# Patient Record
Sex: Female | Born: 1995 | Hispanic: Yes | Smoking: Never smoker
Health system: Southern US, Community
[De-identification: ages and names within clinical notes are randomized; demographics above are authoritative.]

## PROBLEM LIST (undated history)

## (undated) ENCOUNTER — Inpatient Hospital Stay (HOSPITAL_COMMUNITY): Payer: Self-pay

## (undated) DIAGNOSIS — O1490 Unspecified pre-eclampsia, unspecified trimester: Secondary | ICD-10-CM

## (undated) HISTORY — PX: NO PAST SURGERIES: SHX2092

---
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
## 2017-11-11 NOTE — L&D Delivery Note (Addendum)
Patient is [age] y.o. G2P1001 1659w2d admitted in SOL, hx of Pre-E in previous pregnancy   Delivery Note At 2:29 PM a viable female was delivered via Vaginal, Spontaneous (Presentation: Cephalic; LOA ).  APGAR: 8, 9; weight  .   Placenta status: spontaneous, intact.  Cord: 3VC   Anesthesia:  IV fentanyl Episiotomy:  none Lacerations:  Right labial, left sulcus laceration Suture Repair: 3.0 vicryl Est. Blood Loss (mL):  600  Mom to postpartum.  Baby to Couplet care / Skin to Skin.  Upon arrival patient was complete and pushing. She pushed with good maternal effort to deliver a healthy baby girl. Baby delivered following a roughly 30 second shoulder dystocia, McRoberts maneuver was performed, suprapubic pressure was applied and ultimately, the posterior shoulder was hooked to deliver the posterior shoulder which resulted in the delivery of the impacted shoulder  Baby was noted to have good tone and placed on maternal abdomen for oral suctioning, drying and stimulation. Delayed cord clamping performed. Placenta delivered intact with 3V cord. Vaginal canal and perineum was inspected and found to have a right labial laceration in addition to a left sulcus laceration.  The labial laceration was repaired with 3-0 vicryl and the sulcus laceration was hemostatic without repair. Pitocin was started and uterus massaged until bleeding slowed. Counts of sharps, instruments, and lap pads were all correct.   Mirian MoPeter Frank, MD PGY-1 9/16/20192:57 PM   OB FELLOW DELIVERY ATTESTATION  I was gloved and present for the delivery in its entirety, and I agree with the above resident's note.    Marcy Sirenatherine Burdette Gergely, D.O. OB Fellow  07/27/2018, 4:12 PM

## 2018-01-12 LAB — OB RESULTS CONSOLE GC/CHLAMYDIA
Chlamydia: NEGATIVE
GC PROBE AMP, GENITAL: NEGATIVE

## 2018-01-12 LAB — OB RESULTS CONSOLE HEPATITIS B SURFACE ANTIGEN: Hepatitis B Surface Ag: NEGATIVE

## 2018-01-12 LAB — OB RESULTS CONSOLE HIV ANTIBODY (ROUTINE TESTING): HIV: NONREACTIVE

## 2018-01-12 LAB — OB RESULTS CONSOLE RUBELLA ANTIBODY, IGM: RUBELLA: NON-IMMUNE/NOT IMMUNE

## 2018-01-12 LAB — OB RESULTS CONSOLE RPR: RPR: NONREACTIVE

## 2018-01-16 ENCOUNTER — Other Ambulatory Visit (HOSPITAL_COMMUNITY): Payer: Self-pay | Admitting: Nurse Practitioner

## 2018-01-16 DIAGNOSIS — Z369 Encounter for antenatal screening, unspecified: Secondary | ICD-10-CM

## 2018-01-21 ENCOUNTER — Encounter (HOSPITAL_COMMUNITY): Payer: Self-pay | Admitting: Nurse Practitioner

## 2018-01-27 ENCOUNTER — Encounter (HOSPITAL_COMMUNITY): Payer: Self-pay | Admitting: *Deleted

## 2018-01-28 ENCOUNTER — Ambulatory Visit (HOSPITAL_COMMUNITY): Payer: Medicaid Other

## 2018-01-28 ENCOUNTER — Ambulatory Visit (HOSPITAL_COMMUNITY)
Admission: RE | Admit: 2018-01-28 | Discharge: 2018-01-28 | Disposition: A | Discharge status: Home / Self Care | Payer: Medicaid Other | Source: Ambulatory Visit | Attending: Nurse Practitioner | Admitting: Nurse Practitioner

## 2018-01-28 ENCOUNTER — Encounter (HOSPITAL_COMMUNITY): Payer: Self-pay

## 2018-01-28 DIAGNOSIS — Z3682 Encounter for antenatal screening for nuchal translucency: Secondary | ICD-10-CM | POA: Diagnosis not present

## 2018-01-28 DIAGNOSIS — O99211 Obesity complicating pregnancy, first trimester: Secondary | ICD-10-CM | POA: Insufficient documentation

## 2018-01-28 DIAGNOSIS — O09291 Supervision of pregnancy with other poor reproductive or obstetric history, first trimester: Secondary | ICD-10-CM | POA: Diagnosis not present

## 2018-01-28 DIAGNOSIS — Z3A13 13 weeks gestation of pregnancy: Secondary | ICD-10-CM | POA: Diagnosis not present

## 2018-01-28 DIAGNOSIS — Z369 Encounter for antenatal screening, unspecified: Secondary | ICD-10-CM

## 2018-01-28 HISTORY — DX: Unspecified pre-eclampsia, unspecified trimester: O14.90

## 2018-02-04 ENCOUNTER — Other Ambulatory Visit: Payer: Self-pay

## 2018-06-29 ENCOUNTER — Other Ambulatory Visit: Payer: Self-pay

## 2018-06-29 ENCOUNTER — Observation Stay (HOSPITAL_COMMUNITY)
Admission: AD | Admit: 2018-06-29 | Discharge: 2018-07-01 | Disposition: A | Discharge status: Home / Self Care | Payer: Medicaid Other | Source: Ambulatory Visit | Attending: Obstetrics & Gynecology | Admitting: Obstetrics & Gynecology

## 2018-06-29 ENCOUNTER — Inpatient Hospital Stay (HOSPITAL_BASED_OUTPATIENT_CLINIC_OR_DEPARTMENT_OTHER): Payer: Medicaid Other

## 2018-06-29 ENCOUNTER — Encounter (HOSPITAL_COMMUNITY): Payer: Self-pay

## 2018-06-29 DIAGNOSIS — Z3A35 35 weeks gestation of pregnancy: Secondary | ICD-10-CM | POA: Diagnosis not present

## 2018-06-29 DIAGNOSIS — W109XXA Fall (on) (from) unspecified stairs and steps, initial encounter: Secondary | ICD-10-CM | POA: Insufficient documentation

## 2018-06-29 DIAGNOSIS — O9A219 Injury, poisoning and certain other consequences of external causes complicating pregnancy, unspecified trimester: Secondary | ICD-10-CM

## 2018-06-29 DIAGNOSIS — Y999 Unspecified external cause status: Secondary | ICD-10-CM | POA: Insufficient documentation

## 2018-06-29 DIAGNOSIS — Y929 Unspecified place or not applicable: Secondary | ICD-10-CM | POA: Diagnosis not present

## 2018-06-29 DIAGNOSIS — O09293 Supervision of pregnancy with other poor reproductive or obstetric history, third trimester: Secondary | ICD-10-CM | POA: Diagnosis not present

## 2018-06-29 DIAGNOSIS — Y939 Activity, unspecified: Secondary | ICD-10-CM | POA: Insufficient documentation

## 2018-06-29 DIAGNOSIS — W19XXXA Unspecified fall, initial encounter: Secondary | ICD-10-CM | POA: Diagnosis present

## 2018-06-29 DIAGNOSIS — O99213 Obesity complicating pregnancy, third trimester: Secondary | ICD-10-CM | POA: Diagnosis not present

## 2018-06-29 LAB — WET PREP, GENITAL
Sperm: NONE SEEN
Trich, Wet Prep: NONE SEEN
Yeast Wet Prep HPF POC: NONE SEEN

## 2018-06-29 LAB — POCT FERN TEST: POCT FERN TEST: NEGATIVE

## 2018-06-29 MED ORDER — DOCUSATE SODIUM 100 MG PO CAPS
100.0000 mg | ORAL_CAPSULE | Freq: Every day | ORAL | Status: DC
  Administered 2018-07-01: 100 mg via ORAL
  Filled 2018-06-29 (×3): qty 1

## 2018-06-29 MED ORDER — ACETAMINOPHEN 325 MG PO TABS: 650.0000 mg | ORAL_TABLET | ORAL | Status: DC | PRN

## 2018-06-29 MED ORDER — PRENATAL MULTIVITAMIN CH
1.0000 | ORAL_TABLET | Freq: Every day | ORAL | Status: DC
  Administered 2018-07-01: 1 via ORAL
  Filled 2018-06-29 (×3): qty 1

## 2018-06-29 MED ORDER — CALCIUM CARBONATE ANTACID 500 MG PO CHEW: 2.0000 | CHEWABLE_TABLET | ORAL | Status: DC | PRN

## 2018-06-29 MED ORDER — ZOLPIDEM TARTRATE 5 MG PO TABS: 5.0000 mg | ORAL_TABLET | Freq: Every evening | ORAL | Status: DC | PRN

## 2018-06-29 NOTE — MAU Provider Note (Addendum)
Chief Complaint:  Fall   First Provider Initiated Contact with Patient 06/29/18 1807     HPI: Cindy Moreno is a 22 y.o. G2P1001 at 7035w2dwho presents to maternity admissions reporting fall down two steps landing on her buttocks at 3pm.  Feels tightening, but no pain with contractions.  Has moisture in genitalia but no liquid on clothing or pad. . She reports good fetal movement, denies LOF, vaginal bleeding, vaginal itching/burning, urinary symptoms, h/a, dizziness, n/v, diarrhea, constipation or fever/chills.  .  Fall  The accident occurred 1 to 3 hours ago. She fell from a height of 1 to 2 ft. There was no blood loss. The point of impact was the buttocks. The patient is experiencing no pain. Pertinent negatives include no abdominal pain, fever, headaches, nausea, numbness or tingling. She has tried nothing for the symptoms.    RN Note: Pt states that she had fell down 2 steps at 1500.  Pt reports feeling wet after the fall. She states the fluid was yellow. Pt reports ctx's since this morning that are 10 minutes apart.   Past Medical History: Past Medical History:  Diagnosis Date  . Preeclampsia 2014    Past obstetric history: OB History  Gravida Para Term Preterm AB Living  2 1 1     1   SAB TAB Ectopic Multiple Live Births               # Outcome Date GA Lbr Len/2nd Weight Sex Delivery Anes PTL Lv  2 Current           1 Term             Past Surgical History: Past Surgical History:  Procedure Laterality Date  . NO PAST SURGERIES      Family History: No family history on file.  Social History: Social History   Tobacco Use  . Smoking status: Never Smoker  . Smokeless tobacco: Never Used  Substance Use Topics  . Alcohol use: No    Frequency: Never  . Drug use: No    Allergies: No Known Allergies  Meds:  Medications Prior to Admission  Medication Sig Dispense Refill Last Dose  . Prenatal Vit-Fe Fumarate-FA (PRENATAL VITAMIN PO) Take by mouth.   Taking     I have reviewed patient's Past Medical Hx, Surgical Hx, Family Hx, Social Hx, medications and allergies.   ROS:  Review of Systems  Constitutional: Negative for fever.  Gastrointestinal: Negative for abdominal pain and nausea.  Neurological: Negative for tingling, numbness and headaches.   Other systems negative  Physical Exam   Constitutional: Well-developed, well-nourished female in no acute distress.  Cardiovascular: normal rate and rhythm Respiratory: normal effort, clear to auscultation bilaterally GI: Abd soft, non-tender, gravid appropriate for gestational age.   No rebound or guarding. MS: Extremities nontender, no edema, normal ROM Neurologic: Alert and oriented x 4.  GU: Neg CVAT.  PELVIC EXAM: deferred.     FHT:  Baseline 135 , moderate variability, accelerations present, no decelerations Contractions: q 4-5 mins Irregular    MAU Course/MDM: NST reviewed Consult Dr Debroah LoopArnold with presentation, exam findings  He recommends observation over time.  Since contractions are not painful will not necessarily admit unless contractions get stronger over 4 hours Treatments in MAU included EFM x 4 hours.  .    Report given to oncoming provider.  Wynelle BourgeoisMarie Williams CNM, MSN Certified Nurse-Midwife 06/29/2018 6:07 PM  Sterile speculum examination and cervical examination performed for evaluation of possible rupture  of membranes when patient fell. She reports have a gush of fluid come out that was noted to be yellow mucus when she fell. She denies having to change underwear or having to wear a pad after possible rupture.   Fern negative. Membranes intact. SVE noted to have cervix dilated to 4cm  Dilation: 4 Effacement (%): Thick Cervical Position: Posterior Station: -3 Presentation: Vertex Exam by:: Lanice ShirtsV. Chastidy Ranker CNM  US limited completed on 8/19 Reviewed and no abnormalities to placenta, anterior placenta AFV: Subjectively low normal. AFI SUM 10.5cm  Presentation Cephalic    C/w Dr Debroah LoopArnold with US results and SVE with patient continue to contract every 4-6 minutes. Patient denies feeling contractions and reports never been checked.   ASSESSMENT 1. Fall   2. [redacted] weeks gestation of pregnancy   3.     Preterm uterine contractions   PLAN Admit to Antenatal for 24 hour observation  Orders placed  Care taken over by Dr Debroah LoopArnold   Sharyon CableVeronica C Marlyn Tondreau, CNM 06/29/18, 11:23PM

## 2018-06-29 NOTE — MAU Note (Signed)
Pt states that she had fell down 2 steps at 1500.   Pt reports feeling wet after the fall. She states the fluid was yellow.  Pt reports ctx's since this morning that are 10 minutes apart.

## 2018-06-30 ENCOUNTER — Other Ambulatory Visit: Payer: Self-pay

## 2018-06-30 DIAGNOSIS — O4703 False labor before 37 completed weeks of gestation, third trimester: Secondary | ICD-10-CM

## 2018-06-30 DIAGNOSIS — Z3A35 35 weeks gestation of pregnancy: Secondary | ICD-10-CM | POA: Diagnosis not present

## 2018-06-30 LAB — ABO/RH: ABO/RH(D): O POS

## 2018-06-30 LAB — TYPE AND SCREEN
ABO/RH(D): O POS
Antibody Screen: NEGATIVE

## 2018-06-30 MED ORDER — BETAMETHASONE SOD PHOS & ACET 6 (3-3) MG/ML IJ SUSP
12.0000 mg | INTRAMUSCULAR | Status: AC
  Administered 2018-06-30 – 2018-07-01 (×2): 12 mg via INTRAMUSCULAR
  Filled 2018-06-30 (×2): qty 2

## 2018-06-30 MED ORDER — NIFEDIPINE ER OSMOTIC RELEASE 30 MG PO TB24
30.0000 mg | ORAL_TABLET | Freq: Two times a day (BID) | ORAL | Status: DC
  Administered 2018-06-30 – 2018-07-01 (×3): 30 mg via ORAL
  Filled 2018-06-30 (×3): qty 1

## 2018-06-30 NOTE — Progress Notes (Addendum)
OB Note Maternal fetus status unchanged. SVE unchanged. Will keep overnight, given her cx dilation and if unchanged and still cephalic; can be discharged after her 2nd dose of BMZ tomorrow morning. Can d/c EFM and repeat in morning. Pt to let us know if PTL s/s come back and will place back on EFM.  Cornelia Copaharlie Gaylord Seydel, Jr MD Attending Center for [facility] (Faculty Practice) 06/30/2018 Time: [phone]

## 2018-06-30 NOTE — Progress Notes (Signed)
OB Note Category I with accels, rare UCs Feels occasional UCs pt states approx q7755m NAD SVE: 4-5/30/high/thick  A/p: pt doing well Unchanged cx. Continue efm until 1530 today (24hrs s/p fall). If unchanged, likely can d/c to home. Can come to clinic tomorrow for BMZ #2  Cindy Moreno, Jr MD Attending Center for [facility] ([facility]) 06/30/2018 Time: 1242pm

## 2018-06-30 NOTE — Progress Notes (Signed)
Patient ID: Cindy Moreno, female   DOB: 07/05/1996, 22 y.o.   MRN: [id number] FACULTY PRACTICE ANTEPARTUM(COMPREHENSIVE) NOTE  Cindy Moreno is a 22 y.o. G2P1001 at 7546w3d by best clinical estimate who is admitted for observation for preterm labor after a fall on stairs.   Fetal presentation is cephalic. Length of Stay:  0  Days  Subjective: Yellow mucus discharge  Patient reports the fetal movement as active. Patient reports uterine contraction  activity as occasional. Patient reports  vaginal bleeding as scant staining. Patient describes fluid per vagina as None.  Vitals:  Blood pressure (!) 93/54, pulse (!) 103, temperature 97.9 F (36.6 C), temperature source Oral, resp. rate 19, height 5\' 11"  (1.803 m), last menstrual period 10/25/2017, SpO2 99 %. Physical Examination:  General appearance - alert, well appearing, and in no distress Heart - normal rate and regular rhythm Abdomen - soft, nontender, nondistended Fundal Height:  size equals dates Cervical Exam: Evaluated by digital exam. and found to be 5/ 50%/-3 and fetal presentation is cephalic. Extremities: extremities normal, atraumatic, no cyanosis or edema and Homans sign is negative, no sign of DVT Membranes:intact  Fetal Monitoring:   Reviewed,  Fetal Heart Rate A  Mode External filed at 06/30/2018 0701  Baseline Rate (A) 130 bpm filed at 06/30/2018 0701  Variability 6-25 BPM filed at 06/30/2018 0701  Accelerations 15 x 15 filed at 06/30/2018 0701  Decelerations None filed at 06/30/2018 0701     Labs:  Results for orders placed or performed during the hospital encounter of 06/29/18 (from the past 24 hour(s))  Kicking HorseFern Test   Collection Time: 06/29/18  8:45 PM  Result Value Ref Range   POCT Fern Test Negative = intact amniotic membranes   Wet prep, genital   Collection Time: 06/29/18 10:05 PM  Result Value Ref Range   Yeast Wet Prep HPF POC NONE SEEN NONE SEEN   Trich, Wet Prep NONE SEEN NONE SEEN   Clue  Cells Wet Prep HPF POC PRESENT (A) NONE SEEN   WBC, Wet Prep HPF POC MANY (A) NONE SEEN   Sperm NONE SEEN   Type and screen [facility]   Collection Time: 06/29/18 11:49 PM  Result Value Ref Range   ABO/RH(D) O POS    Antibody Screen NEG    Sample Expiration      07/02/2018 Performed at [facility], [address] [place], [place] [address]   ABO/Rh   Collection Time: 06/29/18 11:49 PM  Result Value Ref Range   ABO/RH(D)      O POS Performed at [facility], [address] [place], [place] [address]    .  Medications:  Scheduled . docusate sodium  100 mg Oral Daily  . prenatal multivitamin  1 tablet Oral Q1200   I have reviewed the patient's current medications.  ASSESSMENT: Patient Active Problem List   Diagnosis Date Noted  . Fall 06/29/2018  1746w3d Preterm labor  PLAN: Betamethasone, continue observation for preterm labor. Procardia x 1   Scheryl DarterJames Katonya Blecher 06/30/2018,7:20 AM

## 2018-07-01 DIAGNOSIS — Z3A35 35 weeks gestation of pregnancy: Secondary | ICD-10-CM | POA: Diagnosis not present

## 2018-07-01 DIAGNOSIS — O4703 False labor before 37 completed weeks of gestation, third trimester: Secondary | ICD-10-CM | POA: Diagnosis not present

## 2018-07-01 NOTE — Discharge Summary (Signed)
Discharge Summary   Admit Date: 06/29/2018 Discharge Date: 07/01/2018 Discharging Service: Antepartum  [facility] Admitting Physician: Burney Bingharlie Areeba Sulser, MD  Discharge Physician: Vergie LivingPickens  Patient admitted overnight for fall (non belly, non traumatic) but with SVE at 4-5cm and contractions. She received BMZ on 8/20 and 8/21 and s/s went away with procardia xl. SVE unchanged prior to discharge and s/s improved. 8/19 u/s ceph, AFI 10.5, normal placenta.  Conflict (See Lab Report): O POS/O POS Performed at [facility], [address] [place], [place] [address]  Discharge Medications: Allergies as of 07/01/2018   No Known Allergies     Medication List    TAKE these medications   PRENATAL VITAMIN PO Take by mouth.       Patient has followed up with the HD next week and was told to keep that appointment.   Cornelia Copaharlie Summerlynn Glauser, Jr. MD Attending Center for [facility](Faculty Practice)

## 2018-07-01 NOTE — Discharge Instructions (Signed)
Preterm Labor and Birth Information   What should I do if I am in preterm labor? If you think you are going into labor too soon, call your doctor right away. How can I prevent preterm labor?  Do not use any tobacco products. ? Examples of these are cigarettes, chewing tobacco, and e-cigarettes. ? If you need help quitting, ask your doctor.  Do not use street drugs.  Do not use any medicines unless you ask your doctor if they are safe for you.  Talk with your doctor before taking any herbal supplements.  Make sure you gain enough weight.  Watch for infection. If you think you might have an infection, get it checked right away.  If you have gone into preterm labor before, tell your doctor. This information is not intended to replace advice given to you by your health care provider. Make sure you discuss any questions you have with your health care provider. Document Released: 01/24/2009 Document Revised: 04/09/2016 Document Reviewed: 03/20/2016 Elsevier Interactive Patient Education  2018 [employer]

## 2018-07-06 ENCOUNTER — Inpatient Hospital Stay (HOSPITAL_COMMUNITY)
Admission: AD | Admit: 2018-07-06 | Discharge: 2018-07-06 | Disposition: A | Discharge status: Home / Self Care | Payer: Medicaid Other | Source: Ambulatory Visit | Attending: Obstetrics and Gynecology | Admitting: Obstetrics and Gynecology

## 2018-07-06 ENCOUNTER — Encounter (HOSPITAL_COMMUNITY): Payer: Self-pay | Admitting: *Deleted

## 2018-07-06 DIAGNOSIS — Z3A36 36 weeks gestation of pregnancy: Secondary | ICD-10-CM | POA: Diagnosis not present

## 2018-07-06 DIAGNOSIS — O479 False labor, unspecified: Secondary | ICD-10-CM | POA: Diagnosis not present

## 2018-07-06 LAB — OB RESULTS CONSOLE GC/CHLAMYDIA
Chlamydia: NEGATIVE
Gonorrhea: NEGATIVE

## 2018-07-06 LAB — OB RESULTS CONSOLE GBS: STREP GROUP B AG: POSITIVE

## 2018-07-06 NOTE — MAU Note (Signed)
Pt was sent from health department for monitoring. She had a VE last week and was 5 cms. And she went back today and was still 5cm but was contracting. Denies any VB or LOF

## 2018-07-06 NOTE — Discharge Instructions (Signed)
Información sobre parto y trabajo de parto prematuros °(Preterm Labor and Birth Information) °La duración de un embarazo normal es de 39 a 41 semanas. Se llama trabajo de parto prematuro cuando se inicia antes de las 37 semanas de embarazo. °¿CUÁLES SON LOS FACTORES DE RIESGO DEL TRABAJO DE PARTO PREMATURO? °Existen mayores probabilidades de trabajo de parto prematuro en mujeres con las siguientes características: °· Tienen ciertas infecciones durante el embarazo, como infección de vejiga, infección de transmisión sexual o infección en el útero (corioamnionitis). °· Tienen el cuello del útero más corto que lo normal. °· Tuvieron trabajo de parto prematuro anteriormente. °· Se sometieron a una cirugía en el cuello del útero. °· Son menores de 17 años o mayores de 35 años de edad. °· Son afroamericanas. °· Están embarazadas de mellizos o de varios bebés (gestación múltiple). °· Consumen drogas o fuman mientras están embarazadas. °· No aumentan de peso lo suficiente durante el embarazo. °· Se embarazan poco después de haber estado embarazadas. °¿CUÁLES SON LOS SÍNTOMAS DEL TRABAJO DE PARTO PREMATURO? °Los síntomas del trabajo de parto prematuro incluyen lo siguiente: °· Calambres similares a los que ocurren durante el período menstrual. Los calambres pueden presentarse con diarrea. °· Dolor en el abdomen o en la parte inferior de la espalda. °· Contracciones uterinas regulares que se pueden sentir como una presión en el abdomen. °· Una sensación de mayor presión en la pelvis. °· Aumento de la secreción de moco acuoso o sanguinolento en la vagina. °· Rotura de bolsa (rotura de saco amniótico). °¿POR QUÉ ES IMPORTANTE RECONOCER LOS SIGNOS DEL TRABAJO DE PARTO PREMATURO? °Es importante reconocer los signos del trabajo de parto prematuro porque los bebés que nacen de forma prematura pueden no estar completamente desarrollados. Por lo tanto, pueden correr mayor riesgo de lo siguiente: °· Problemas cardíacos y pulmonares a  largo plazo (crónicos). °· Inmediatamente después del parto, dificultades para regular los sistemas corporales, que incluyen glucemia, temperatura corporal, frecuencia cardíaca y frecuencia respiratoria. °· Hemorragia cerebral. °· Parálisis cerebral. °· Dificultades en el aprendizaje. °· Muerte. °Estos riesgos son mucho mayores para bebés que nacen antes de las 34 semanas de embarazo. °¿CÓMO SE TRATA EL TRABAJO DE PARTO PREMATURO? °El tratamiento depende del tiempo de su embarazo, su afección y la salud de su bebé. Puede incluir lo siguiente: °· Tener un punto (sutura) en el cuello del útero para evitar que este se abra demasiado pronto (cerclaje). °· Tomar medicamentos, por ejemplo: °? Medicamentos hormonales. Estos se pueden administrar de forma temprana en el embarazo para ayudar a mantener el embarazo. °? Medicamentos para detener las contracciones. °? Medicamentos que ayudan a madurar los pulmones del bebé. Estos se pueden recetar si el riesgo de parto es alto. °? Medicamentos para evitar que el bebé desarrolle parálisis cerebral. °Si el trabajo de parto de inicia antes de las 34 semanas de embarazo, es posible que deba hospitalizarse. °¿QUÉ DEBO HACER SI CREO QUE ESTOY EN TRABAJO DE PARTO PREMATURO? °Si cree que está iniciando trabajo de parto prematuro, llame al médico de inmediato. °¿CÓMO PUEDO EVITAR EL TRABAJO DE PARTO PREMATURO EN FUTUROS EMBARAZOS? °Para aumentar las probabilidades de tener un embarazo a término, tenga en cuenta lo siguiente: °· No consuma ningún producto que contenga tabaco, lo que incluye cigarrillos, tabaco de mascar y cigarrillos electrónicos. Si necesita ayuda para dejar de fumar, consulte al médico. °· No consuma drogas ni medicamentos que no sean recetados durante el embarazo. °· Hable con el médico antes de tomar suplementos a base de hierbas aunque los haya estado tomando   peridicamente.  Asegrese de llegar a un peso Office managersaludable durante el embarazo.  Tenga cuidado con las  infecciones. Si cree que puede tener una infeccin, consulte al mdico para que la revisen.  Asegrese de informarle al mdico si ha tenido trabajo de parto prematuro antes. Esta informacin no tiene Theme park managercomo fin reemplazar el consejo del mdico. Asegrese de hacerle al mdico cualquier pregunta que tenga. Document Released: 02/04/2008 Document Revised: 06/30/2013 Document Reviewed: 03/20/2016 Elsevier Interactive Patient Education  2018 Elsevier Inc.   Reposo plvico (Pelvic Rest) CUNDO SE RECOMIENDA EL REPOSO PLVICO? El reposo plvico puede recomendarse en los siguientes casos:  La placenta cubre de forma parcial o total la abertura del cuello del tero (placenta previa).  Hay sangrado entre la pared del tero y el saco amnitico en el primer trimestre de Psychiatristembarazo (hemorragia subcorinica).  El [place] Lak[place]ajo de parto comienza muy pronto (trabajo de parto prematuro). Segn la salud general de la madre y el feto, el mdico decidir si el reposo plvico es [place]rap[place]adecuado. CMO HAGO REPOSO PLVICO? Durante el tiempo que le indique el mdico:  No tenga relaciones sexuales, estimulacin sexual ni orgasmos.  No use tampones. No se haga duchas vaginales. No se introduzca nada en la vagina.  No levante ningn objeto que pese ms de 10libras (4,5kg).  Evite las actividades que demanden mucho esfuerzo (extenuantes).  Evite las actividades que requieran esfuerzos de los msculos de la pelvis. CUNDO DEBO BUSCAR ATENCIN MDICA? Solicite atencin mdica de inmediato si:  Tiene clicos en la zona inferior del abdomen.  Tiene secrecin de flujo vaginal.  Tiene un dolor sordo en la parte baja de la espalda.  Tiene contracciones regulares.  Tienen tensin uterina. CUNDO DEBO BUSCAR ASISTENCIA MDICA INMEDIATA? Solicite atencin mdica de inmediato si:  Tiene sangrado vaginal y est embarazada. Esta informacin no tiene Theme park managercomo fin reemplazar el consejo del mdico. Asegrese de hacerle al  mdico cualquier pregunta que tenga. Document Released: 07/22/2012 Document Revised: 02/19/2016 Document Reviewed: 05/01/2015 Elsevier Interactive Patient Education  [employer]2018 Elsevier Inc.

## 2018-07-06 NOTE — MAU Note (Signed)
I have communicated with Wynelle BourgeoisMarie Williams CNM and reviewed vital signs:  Vitals:   07/06/18 1144  BP: 109/75  Pulse: (!) 108  Resp: 18  Temp: 98.2 F (36.8 C)    Vaginal exam:  Dilation: 5 Effacement (%): 70 Station: -3 Exam by:: Wolf Boulay rn,   Also reviewed contraction pattern and that non-stress test is reactive.  It has been documented that patient is contracting every occasionally with no  cervical change over 1hour not indicating active labor.  Patient denies any other complaints.  Based on this report provider has given order for discharge.  A discharge order and diagnosis entered by a provider.   Labor discharge instructions reviewed with patient.

## 2018-07-27 ENCOUNTER — Encounter (HOSPITAL_COMMUNITY): Payer: Self-pay | Admitting: *Deleted

## 2018-07-27 ENCOUNTER — Inpatient Hospital Stay (HOSPITAL_COMMUNITY)
Admission: AD | Admit: 2018-07-27 | Discharge: 2018-07-29 | DRG: 807 | Disposition: A | Discharge status: Home / Self Care | Payer: Medicaid Other | Attending: Obstetrics and Gynecology | Admitting: Obstetrics and Gynecology

## 2018-07-27 DIAGNOSIS — Z3A39 39 weeks gestation of pregnancy: Secondary | ICD-10-CM

## 2018-07-27 DIAGNOSIS — O99824 Streptococcus B carrier state complicating childbirth: Secondary | ICD-10-CM | POA: Diagnosis present

## 2018-07-27 DIAGNOSIS — Z3483 Encounter for supervision of other normal pregnancy, third trimester: Secondary | ICD-10-CM | POA: Diagnosis present

## 2018-07-27 LAB — COMPREHENSIVE METABOLIC PANEL
ALT: 53 U/L — AB (ref 0–44)
AST: 33 U/L (ref 15–41)
Albumin: 2.9 g/dL — ABNORMAL LOW (ref 3.5–5.0)
Alkaline Phosphatase: 216 U/L — ABNORMAL HIGH (ref 38–126)
Anion gap: 11 (ref 5–15)
BILIRUBIN TOTAL: 0.3 mg/dL (ref 0.3–1.2)
BUN: 9 mg/dL (ref 6–20)
CO2: 19 mmol/L — ABNORMAL LOW (ref 22–32)
CREATININE: 0.56 mg/dL (ref 0.44–1.00)
Calcium: 9.3 mg/dL (ref 8.9–10.3)
Chloride: 106 mmol/L (ref 98–111)
GFR calc Af Amer: >60 mL/min (ref >60)
Glucose, Bld: 72 mg/dL (ref 70–99)
POTASSIUM: 3.8 mmol/L (ref 3.5–5.1)
Sodium: 136 mmol/L (ref 135–145)
TOTAL PROTEIN: 6.7 g/dL (ref 6.5–8.1)

## 2018-07-27 LAB — PROTEIN / CREATININE RATIO, URINE
CREATININE, URINE: 145 mg/dL
Protein Creatinine Ratio: 0.13 mg/mg{Cre} (ref 0.00–0.15)
Total Protein, Urine: 19 mg/dL

## 2018-07-27 LAB — CBC
HCT: 39.7 % (ref 36.0–46.0)
HEMOGLOBIN: 13.4 g/dL (ref 12.0–15.0)
MCH: 29.6 pg (ref 26.0–34.0)
MCHC: 33.8 g/dL (ref 30.0–36.0)
MCV: 87.6 fL (ref 78.0–100.0)
Platelets: 245 10*3/uL (ref 150–400)
RBC: 4.53 MIL/uL (ref 3.87–5.11)
RDW: 14.4 % (ref 11.5–15.5)
WBC: 8 10*3/uL (ref 4.0–10.5)

## 2018-07-27 LAB — TYPE AND SCREEN
ABO/RH(D): O POS
ANTIBODY SCREEN: NEGATIVE

## 2018-07-27 MED ORDER — ACETAMINOPHEN 325 MG PO TABS: 650.0000 mg | ORAL_TABLET | ORAL | Status: DC | PRN

## 2018-07-27 MED ORDER — IBUPROFEN 600 MG PO TABS
600.0000 mg | ORAL_TABLET | Freq: Four times a day (QID) | ORAL | Status: DC
  Administered 2018-07-27 – 2018-07-29 (×7): 600 mg via ORAL
  Filled 2018-07-27 (×8): qty 1

## 2018-07-27 MED ORDER — ZOLPIDEM TARTRATE 5 MG PO TABS: 5.0000 mg | ORAL_TABLET | Freq: Every evening | ORAL | Status: DC | PRN

## 2018-07-27 MED ORDER — FLEET ENEMA 7-19 GM/118ML RE ENEM: 1.0000 | ENEMA | RECTAL | Status: DC | PRN

## 2018-07-27 MED ORDER — TETANUS-DIPHTH-ACELL PERTUSSIS 5-2.5-18.5 LF-MCG/0.5 IM SUSP: 0.5000 mL | Freq: Once | INTRAMUSCULAR | Status: DC

## 2018-07-27 MED ORDER — FENTANYL CITRATE (PF) 100 MCG/2ML IJ SOLN
100.0000 ug | INTRAMUSCULAR | Status: DC | PRN
  Administered 2018-07-27 (×3): 100 ug via INTRAVENOUS
  Filled 2018-07-27 (×2): qty 2

## 2018-07-27 MED ORDER — SOD CITRATE-CITRIC ACID 500-334 MG/5ML PO SOLN: 30.0000 mL | ORAL | Status: DC | PRN

## 2018-07-27 MED ORDER — FENTANYL CITRATE (PF) 100 MCG/2ML IJ SOLN
INTRAMUSCULAR | Status: AC
  Administered 2018-07-27: 100 ug via INTRAVENOUS
  Filled 2018-07-27: qty 2

## 2018-07-27 MED ORDER — LACTATED RINGERS IV SOLN: 500.0000 mL | INTRAVENOUS | Status: DC | PRN

## 2018-07-27 MED ORDER — PRENATAL MULTIVITAMIN CH
1.0000 | ORAL_TABLET | Freq: Every day | ORAL | Status: DC
  Administered 2018-07-28 – 2018-07-29 (×2): 1 via ORAL
  Filled 2018-07-27 (×2): qty 1

## 2018-07-27 MED ORDER — ONDANSETRON HCL 4 MG/2ML IJ SOLN: 4.0000 mg | INTRAMUSCULAR | Status: DC | PRN

## 2018-07-27 MED ORDER — LIDOCAINE HCL (PF) 1 % IJ SOLN
30.0000 mL | INTRAMUSCULAR | Status: DC | PRN
  Administered 2018-07-27: 30 mL via SUBCUTANEOUS
  Filled 2018-07-27: qty 30

## 2018-07-27 MED ORDER — OXYTOCIN 40 UNITS IN LACTATED RINGERS INFUSION - SIMPLE MED
2.5000 [IU]/h | INTRAVENOUS | Status: DC
  Filled 2018-07-27: qty 1000

## 2018-07-27 MED ORDER — DIBUCAINE 1 % RE OINT: 1.0000 "application " | TOPICAL_OINTMENT | RECTAL | Status: DC | PRN

## 2018-07-27 MED ORDER — ONDANSETRON HCL 4 MG PO TABS: 4.0000 mg | ORAL_TABLET | ORAL | Status: DC | PRN

## 2018-07-27 MED ORDER — ONDANSETRON HCL 4 MG/2ML IJ SOLN: 4.0000 mg | Freq: Four times a day (QID) | INTRAMUSCULAR | Status: DC | PRN

## 2018-07-27 MED ORDER — SODIUM CHLORIDE 0.9 % IV SOLN
2.0000 g | Freq: Once | INTRAVENOUS | Status: AC
  Administered 2018-07-27: 2 g via INTRAVENOUS
  Filled 2018-07-27: qty 2

## 2018-07-27 MED ORDER — COCONUT OIL OIL: 1.0000 "application " | TOPICAL_OIL | Status: DC | PRN

## 2018-07-27 MED ORDER — BENZOCAINE-MENTHOL 20-0.5 % EX AERO
1.0000 "application " | INHALATION_SPRAY | CUTANEOUS | Status: DC | PRN
  Administered 2018-07-28: 1 via TOPICAL
  Filled 2018-07-27: qty 56

## 2018-07-27 MED ORDER — MEASLES, MUMPS & RUBELLA VAC ~~LOC~~ INJ
0.5000 mL | INJECTION | Freq: Once | SUBCUTANEOUS | Status: AC
  Administered 2018-07-29: 0.5 mL via SUBCUTANEOUS
  Filled 2018-07-27 (×2): qty 0.5

## 2018-07-27 MED ORDER — WITCH HAZEL-GLYCERIN EX PADS: 1.0000 "application " | MEDICATED_PAD | CUTANEOUS | Status: DC | PRN

## 2018-07-27 MED ORDER — OXYTOCIN BOLUS FROM INFUSION
500.0000 mL | Freq: Once | INTRAVENOUS | Status: AC
  Administered 2018-07-27: 500 mL via INTRAVENOUS

## 2018-07-27 MED ORDER — OXYCODONE-ACETAMINOPHEN 5-325 MG PO TABS: 1.0000 | ORAL_TABLET | ORAL | Status: DC | PRN

## 2018-07-27 MED ORDER — LACTATED RINGERS IV SOLN
INTRAVENOUS | Status: DC
  Administered 2018-07-27: 11:00:00 via INTRAVENOUS

## 2018-07-27 MED ORDER — DIPHENHYDRAMINE HCL 25 MG PO CAPS: 25.0000 mg | ORAL_CAPSULE | Freq: Four times a day (QID) | ORAL | Status: DC | PRN

## 2018-07-27 MED ORDER — OXYCODONE-ACETAMINOPHEN 5-325 MG PO TABS: 2.0000 | ORAL_TABLET | ORAL | Status: DC | PRN

## 2018-07-27 MED ORDER — SIMETHICONE 80 MG PO CHEW: 80.0000 mg | CHEWABLE_TABLET | ORAL | Status: DC | PRN

## 2018-07-27 MED ORDER — SENNOSIDES-DOCUSATE SODIUM 8.6-50 MG PO TABS
2.0000 | ORAL_TABLET | ORAL | Status: DC
  Administered 2018-07-27: 2 via ORAL
  Filled 2018-07-27 (×2): qty 2

## 2018-07-27 NOTE — MAU Note (Signed)
Pt presents with complaint of contractions and pressure 

## 2018-07-27 NOTE — H&P (Addendum)
OBSTETRIC ADMISSION HISTORY AND PHYSICAL  Cindy Moreno is a 22 y.o. female G2P1001 with IUP at [redacted]w[redacted]d by L/13 presenting in active labor. She reports +FMs, No LOF, no VB, no blurry vision, headaches or peripheral edema, and RUQ pain.  She plans on breast and bottle feeding. She request paragard for birth control. She received her prenatal care at [facility]   Dating: By L/13 --->  Estimated Date of Delivery: 08/01/18  Sono:  @[redacted]w[redacted]d ,  normal anatomy   Prenatal History/Complications: Pre-eclampsia, history of pre-e in previous pregnancy, has never received Mg++  Past Medical History: Past Medical History:  Diagnosis Date  . Preeclampsia 2014    Past Surgical History: Past Surgical History:  Procedure Laterality Date  . NO PAST SURGERIES      Obstetrical History: OB History    Gravida  2   Para  1   Term  1   Preterm      AB      Living  1     SAB      TAB      Ectopic      Multiple      Live Births  1           Social History: Social History   Socioeconomic History  . Marital status: Unknown    Spouse name: Not on file  . Number of children: Not on file  . Years of education: Not on file  . Highest education level: Not on file  Occupational History  . Not on file  Social Needs  . Financial resource strain: Not on file  . Food insecurity:    Worry: Not on file    Inability: Not on file  . Transportation needs:    Medical: Not on file    Non-medical: Not on file  Tobacco Use  . Smoking status: Never Smoker  . Smokeless tobacco: Never Used  Substance and Sexual Activity  . Alcohol use: No    Frequency: Never  . Drug use: No  . Sexual activity: Yes  Lifestyle  . Physical activity:    Days per week: Not on file    Minutes per session: Not on file  . Stress: Not on file  Relationships  . Social connections:    Talks on phone: Not on file    Gets together: Not on file    Attends religious service: Not on file    Active member of club  or organization: Not on file    Attends meetings of clubs or organizations: Not on file    Relationship status: Not on file  Other Topics Concern  . Not on file  Social History Narrative  . Not on file    Family History: History reviewed. No pertinent family history.  Allergies: No Known Allergies  Medications Prior to Admission  Medication Sig Dispense Refill Last Dose  . Prenatal Vit-Fe Fumarate-FA (PRENATAL VITAMIN PO) Take by mouth.   07/27/2018 at Unknown time     Review of Systems   All systems reviewed and negative except as stated in HPI  Blood pressure 102/70, pulse (!) 110, temperature 98.3 F (36.8 C), temperature source Oral, resp. rate 16, height 5\' 11"  (1.803 m), weight 115.2 kg, last menstrual period 10/25/2017, SpO2 100 %. General appearance: alert, cooperative, appears stated age, moderate distress and mildly obese Lungs: clear to auscultation bilaterally Heart: regular rate and rhythm Abdomen: soft, non-tender Pelvic: see below Extremities: Homans sign is negative, no sign of DVT  Fetal monitoringBaseline: 150 bpm, Variability: Good {> 6 bpm), Accelerations: Reactive and Decelerations: Absent Uterine activityFrequency: Every 2-3 minutes and Intensity: strong Dilation: 7.5 Effacement (%): 80 Exam by:: Jolynn,Cindy Moreno   Prenatal labs: ABO, Rh: --/--/O POS, O POS Performed at [facility], [address] [place], [place] [address]  [phone](08/19 2349) Antibody: NEG (08/19 2349) Rubella:  non-immune RPR:   neg HBsAg:   neg HIV:   nonreactive GBS:   Positive 1 hr Glucola 116 Genetic screening  declined Anatomy US normal  Prenatal Transfer Tool  Maternal Diabetes: No Genetic Screening: Declined Maternal Ultrasounds/Referrals: Normal Fetal Ultrasounds or other Referrals:  None Maternal Substance Abuse:  No Significant Maternal Medications:  Meds include: Other: ASA Significant Maternal Lab Results: Lab values include: Group B Strep positive  No results  found for this or any previous visit (from the past 24 hour(s)).  Patient Active Problem List   Diagnosis Date Noted  . Fall 06/29/2018    Assessment/Plan:  Cindy Moreno is a 22 y.o. G2P1001 at 122w2d here for SOL.  #Labor: Progressing well without augmentation, Expectant management #Pre-e: pt notes a history of pre-eclampsia in a previous pregnancy and believes she was diagnosed with pre-e during this pregnancy as well (low suspicion as she remained HD patient and [facility]), taking ASA , PIH labs pending, BP WNL #Pain: Planning epidural, IV medication #FWB: Category I #ID:  GBS positive, Amp,  Rubella non-immune #MOF: breast/bottle #MOC:paragard #Circ:  N/a #varicella non-immune  Mirian MoPeter Frank, MD  07/27/2018, 10:28 AM  OB FELLOW HISTORY AND PHYSICAL ATTESTATION  I have seen and examined this patient; I agree with above documentation in the resident's note.   Marcy Sirenatherine Jacelynn Hayton, D.O. OB Fellow  07/27/2018, 11:32 AM

## 2018-07-27 NOTE — Progress Notes (Signed)
LABOR PROGRESS NOTE  Cindy Moreno is a 22 y.o. G2P1001 at 4266w2d  admitted for SOL.   Subjective: Very uncomfortable with ctx. Feeling a lot of pressure in lower back.   Objective: BP 116/67   Pulse (!) 102   Temp 98 F (36.7 C) (Oral)   Resp 16   Ht 5\' 11"  (1.803 m)   Wt 115.2 kg   LMP 10/25/2017   SpO2 100%   BMI 35.43 kg/m  or  Vitals:   07/27/18 1012 07/27/18 1035 07/27/18 1115 07/27/18 1255  BP: 102/70 111/70  116/67  Pulse: (!) 110 97  (!) 102  Resp: 16     Temp: 98.3 F (36.8 C)  98 F (36.7 C)   TempSrc: Oral  Oral   SpO2: 100%     Weight: 115.2 kg     Height: 5\' 11"  (1.803 m)       Dilation: 8.5 Effacement (%): 90 Station: -2 Presentation: Vertex Exam by:: middleton rn FHT: baseline rate 125, moderate varibility, +acel, early decel Toco: q1-2 min   Labs: Lab Results  Component Value Date   WBC 8.0 07/27/2018   HGB 13.4 07/27/2018   HCT 39.7 07/27/2018   MCV 87.6 07/27/2018   PLT 245 07/27/2018    Patient Active Problem List   Diagnosis Date Noted  . Vaginal delivery 07/27/2018  . Fall 06/29/2018    Assessment / Plan: 22 y.o. G2P1001 at 3566w2d here for SOL.   Labor: SROM with clear fluid at 1245. Progressing well. Will try squat bar for position change with ctx and to help progress cervical dilation further.  Fetal Wellbeing:  Cat I  Pain Control:  Unable to provide nitrous oxide due being unavailable. Will give another dose of IV Fentanyl.  Anticipated MOD:  NSVD   [name], D.O. OB Fellow  07/27/2018, 1:57 PM

## 2018-07-28 LAB — COMPREHENSIVE METABOLIC PANEL
ALBUMIN: 2.7 g/dL — AB (ref 3.5–5.0)
ALT: 53 U/L — AB (ref 0–44)
AST: 44 U/L — AB (ref 15–41)
Alkaline Phosphatase: 186 U/L — ABNORMAL HIGH (ref 38–126)
Anion gap: 7 (ref 5–15)
BUN: 6 mg/dL (ref 6–20)
CHLORIDE: 105 mmol/L (ref 98–111)
CO2: 23 mmol/L (ref 22–32)
Calcium: 9.3 mg/dL (ref 8.9–10.3)
Creatinine, Ser: 0.63 mg/dL (ref 0.44–1.00)
GFR calc Af Amer: >60 mL/min (ref >60)
GFR calc non Af Amer: >60 mL/min (ref >60)
GLUCOSE: 88 mg/dL (ref 70–99)
POTASSIUM: 3.9 mmol/L (ref 3.5–5.1)
Sodium: 135 mmol/L (ref 135–145)
Total Bilirubin: 0.3 mg/dL (ref 0.3–1.2)
Total Protein: 6.3 g/dL — ABNORMAL LOW (ref 6.5–8.1)

## 2018-07-28 LAB — RPR: RPR Ser Ql: NONREACTIVE

## 2018-07-28 NOTE — Progress Notes (Signed)
Patient refuses Interpreting services. Eda H Royal Interpreter.

## 2018-07-28 NOTE — Lactation Note (Signed)
This note was copied from a baby's chart. Lactation Consultation Note  Patient Name: Cindy Moreno WUJWJ'XToday's Date: 07/28/2018 Reason for consult: Initial assessment;Term Breastfeeding consultation services and support information given.  Mom breastfed her first baby for 2 years.  She states newborn is latching easily but acts hungry after feedings so she gives small amounts of formula.  Stressed importance of putting baby to breast first with any feeding cue.  Encouraged to call for assist/concerns prn.  Maternal Data Does the patient have breastfeeding experience prior to this delivery?: Yes  Feeding Feeding Type: Breast Fed Nipple Type: Regular Length of feed: 11 min  LATCH Score Latch: Grasps breast easily, tongue down, lips flanged, rhythmical sucking.  Audible Swallowing: Spontaneous and intermittent  Type of Nipple: Everted at rest and after stimulation  Comfort (Breast/Nipple): Soft / non-tender  Hold (Positioning): No assistance needed to correctly position infant at breast.  LATCH Score: 10  Interventions    Lactation Tools Discussed/Used WIC Program: Yes   Consult Status Consult Status: Follow-up Date: 07/29/18 Follow-up type: In-patient    Huston FoleyMOULDEN, [name] 07/28/2018, 2:36 PM

## 2018-07-28 NOTE — Progress Notes (Signed)
POSTPARTUM PROGRESS NOTE  Post Partum Day 1 Subjective:  Cindy Moreno is a 22 y.o. [id number] 4833w2d s/p SVD.  No acute events overnight.  Pt denies problems with ambulating, voiding or po intake.  She denies nausea or vomiting.  Pain is moderately controlled, reports continued cramping in lower abdomen.  She has had flatus. She has not had bowel movement.  Lochia Small and decreased from yesterday.  Denies pre-eclampsia symptoms, visual changes, HA, dizziness, edema, RUQ pain.  Objective: Blood pressure 100/66, pulse 88, temperature 98.5 F (36.9 C), temperature source Oral, resp. rate 18, height 5\' 11"  (1.803 m), weight 115.2 kg, last menstrual period 10/25/2017, SpO2 100 %, unknown if currently breastfeeding.   Physical Exam:  General: alert, cooperative and no distress Lochia:normal flow Chest: CTAB Heart: RRR no m/r/g Abdomen: +BS, soft, nontender,  Uterine Fundus: firm, 1/2 finger-length above umbilicus DVT Evaluation: No calf swelling or tenderness Extremities: no edema  Recent Labs    07/27/18 1047  HGB 13.4  HCT 39.7   CMP Latest Ref Rng & Units 07/28/2018 07/27/2018  Glucose 70 - 99 mg/dL 88 72  BUN 6 - 20 mg/dL 6 9  Creatinine 2.950.44 - 1.00 mg/dL 6.210.63 3.080.56  Sodium 657135 - 145 mmol/L 135 136  Potassium 3.5 - 5.1 mmol/L 3.9 3.8  Chloride 98 - 111 mmol/L 105 106  CO2 22 - 32 mmol/L 23 19(L)  Calcium 8.9 - 10.3 mg/dL 9.3 9.3  Total Protein 6.5 - 8.1 g/dL 6.3(L) 6.7  Total Bilirubin 0.3 - 1.2 mg/dL 0.3 0.3  Alkaline Phos 38 - 126 U/L 186(H) 216(H)  AST 15 - 41 U/L 44(H) 33  ALT 0 - 44 U/L 53(H) 53(H)    Assessment/Plan:  ASSESSMENT: Cindy Moreno is a 22 y.o. [id number] 1333w2d s/p SVD Hx of pre-eclampsia in a prior pregnancy, BP wnl during this admission. Mild elevation of ALT/AST, nml uP:Cr.  Breastfeeding, Lactation consult and Contraception IUD  Bottle feeding for supplement.   LOS: 1 day   Myrtie HawkAriel E Hwang, Medical Student 07/28/2018, 7:36 AM   OB  FELLOW MEDICAL STUDENT NOTE ATTESTATION  I confirm that I have verified the information documented in the medical student's note and that I have also personally performed the physical exam and all medical decision making activities.   Cindy Moreno is 22 y.o. 202P2002 female PPD #1 from SVD. Patient reports feeling well. Ambulating without difficulty, voiding normally, lochia is mild, pain is well controlled. Vital signs stable. Physical exam benign with firm uterine fundus and no LE edema.  Patient is breast and bottle feeding. Plans for IUD for contraception. Plan for d/c tomorrow.   Marcy Sirenatherine Briunna Leicht, D.O. OB Fellow  07/28/2018, 11:16 AM

## 2018-07-29 MED ORDER — IBUPROFEN 600 MG PO TABS: 600.0000 mg | ORAL_TABLET | Freq: Four times a day (QID) | ORAL | 0 refills | Status: AC

## 2018-07-29 NOTE — Lactation Note (Signed)
This note was copied from a baby's chart. Lactation Consultation Note  Patient Name: Cindy Moreno WUJWJ'XToday's Date: 07/29/2018 Reason for consult: Follow-up assessment   Follow up with mom of 46 hour old infant. Infant with 1 BF for 10 minutes, 2 BF attempts, formula x 8 of 10-60 ml, 6 voids and 0 stools in the last 24 hours. Mom confirms last stool at 3 am on 2/17.   Mom reports she is BF prior to each bottle feeding. She reports infant will not latch well. Enc mom to Offer breast with each feeding. If infant will not latch, enc mom to give 1/2 ounce in a bottle and then try to latch infant. Reviewed supply and demand and engorgement prevention.   Reviewed I/O, signs of dehydration in the infant, Signs your infant is getting enough, Engorgement prevention and treatment, and breast milk expression and storage.   Mom has a breast pump at home, she is unsure of the brand. Enc mom to pump if infant not BF to protect milk supply and to prevent engorgement, mom voiced understanding.   [facility] reviewed, mom aware of OP services, LC phone #, and BF Support Groups. [facility]. She is aware of their BF Support Groups also.   Maternal Data Formula Feeding for Exclusion: Yes Reason for exclusion: Mother's choice to formula and breast feed on admission Does the patient have breastfeeding experience prior to this delivery?: Yes  Feeding Feeding Type: Bottle Fed - Formula  LATCH Score                   Interventions    Lactation Tools Discussed/Used WIC Program: Yes   Consult Status Consult Status: Complete Follow-up type: Call as needed    Ed BlalockSharon S Luisalberto Beegle 07/29/2018, 12:47 PM

## 2018-07-29 NOTE — Discharge Summary (Addendum)
Obstetrics Discharge Summary OB/GYN Faculty Practice   Patient Name: Cindy Moreno DOB: [birth year] MRN: [id number]  Date of admission: 07/27/2018 Delivering MD: Mirian Mo   Date of discharge: 07/29/2018  Admitting diagnosis: 39WKS 1-2 MINS  Intrauterine pregnancy: [redacted]w[redacted]d     Secondary diagnosis:   Active Problems:   Vaginal delivery   Additional problems:  . History of PEC     Discharge diagnosis: Term Pregnancy Delivered                                            Postpartum procedures: None  Complications: none  Hospital course: Cindy Moreno is a 22 y.o. [redacted]w[redacted]d who was admitted for SOL. Her pregnancy was complicated by hx of PEC in previous pregnancy. Her labor course was unremarkable. Delivery was complicated by 30 sec shoulder dystocia. Please see delivery/op note for additional details. Her postpartum course was uncomplicated. She was breastfeeding without difficulty. By day of discharge, she was passing flatus, urinating, eating and drinking without difficulty. Her pain was well-controlled, and she was discharged home with tylenol/motrin. She will follow-up in clinic in 4 weeks.   Physical exam  Vitals:   07/28/18 1325 07/28/18 1600 07/28/18 2259 07/29/18 0526  BP: 114/68 104/69 91/63 99/67   Pulse: 100 94 88 75  Resp: 20 20 17 18   Temp: 98.3 F (36.8 C) 98.6 F (37 C) 98.2 F (36.8 C) 97.7 F (36.5 C)  TempSrc: Oral Oral  Oral  SpO2: 97%     Weight:      Height:       General: aaox3, nad Lochia: appropriate Uterine Fundus: firm Incision: N/A DVT Evaluation: No evidence of DVT seen on physical exam. Labs: Lab Results  Component Value Date   WBC 8.0 07/27/2018   HGB 13.4 07/27/2018   HCT 39.7 07/27/2018   MCV 87.6 07/27/2018   PLT 245 07/27/2018   CMP Latest Ref Rng & Units 07/28/2018  Glucose 70 - 99 mg/dL 88  BUN 6 - 20 mg/dL 6  Creatinine 4.09 - 8.11 mg/dL 9.14  Sodium 782 - 956 mmol/L 135  Potassium 3.5 - 5.1 mmol/L 3.9  Chloride 98 - 111  mmol/L 105  CO2 22 - 32 mmol/L 23  Calcium 8.9 - 10.3 mg/dL 9.3  Total Protein 6.5 - 8.1 g/dL 6.3(L)  Total Bilirubin 0.3 - 1.2 mg/dL 0.3  Alkaline Phos 38 - 126 U/L 186(H)  AST 15 - 41 U/L 44(H)  ALT 0 - 44 U/L 53(H)    Discharge instructions: Per After Visit Summary and "Baby and Me Booklet"  After visit meds:  Allergies as of 07/29/2018   No Known Allergies     Medication List    TAKE these medications   acetaminophen 500 MG tablet Commonly known as:  TYLENOL Take 500 mg by mouth every 8 (eight) hours as needed.   ibuprofen 600 MG tablet Commonly known as:  ADVIL,MOTRIN Take 1 tablet (600 mg total) by mouth every 6 (six) hours.   PRENATAL VITAMIN PO Take 1 tablet by mouth daily.       Postpartum contraception: IUD Paragard Diet: Routine Diet Activity: Advance as tolerated. Pelvic rest for 6 weeks.   Outpatient follow up:4wks Follow-up Appt:No future appointments. Follow-up Visit:No follow-ups on file.  Newborn Data: Live born female  Birth Weight: 8 lb 15.7 oz (4074 g) APGAR: 8, 9  Newborn Delivery  Time head delivered:  07/27/2018 14:28:00 Birth date/time:  07/27/2018 14:29:00 Delivery type:  Vaginal, Spontaneous     Baby Feeding: Bottle and Breast Disposition:home with mother  Lorenza BurtonKelsey Ash, MD  Family Medicine, PGY-2    OB FELLOW DISCHARGE ATTESTATION  I have seen and examined this patient and agree with above documentation in the resident's note.   Marcy Sirenatherine Glyndon Tursi, D.O. OB Fellow  07/29/2018, 3:58 PM

## 2018-07-29 NOTE — Discharge Instructions (Signed)
Vaginal Delivery, Care After °Refer to this sheet in the next few weeks. These instructions provide you with information about caring for yourself after vaginal delivery. Your health care provider may also give you more specific instructions. Your treatment has been planned according to current medical practices, but problems sometimes occur. Call your health care provider if you have any problems or questions. °What can I expect after the procedure? °After vaginal delivery, it is common to have: °· Some bleeding from your vagina. °· Soreness in your abdomen, your vagina, and the area of skin between your vaginal opening and your anus (perineum). °· Pelvic cramps. °· Fatigue. ° °Follow these instructions at home: °Medicines °· Take over-the-counter and prescription medicines only as told by your health care provider. °· If you were prescribed an antibiotic medicine, take it as told by your health care provider. Do not stop taking the antibiotic until it is finished. °Driving ° °· Do not drive or operate heavy machinery while taking prescription pain medicine. °· Do not drive for 24 hours if you received a sedative. °Lifestyle °· Do not drink alcohol. This is especially important if you are breastfeeding or taking medicine to relieve pain. °· Do not use tobacco products, including cigarettes, chewing tobacco, or e-cigarettes. If you need help quitting, ask your health care provider. °Eating and drinking °· Drink at least 8 eight-ounce glasses of water every day unless you are told not to by your health care provider. If you choose to breastfeed your baby, you may need to drink more water than this. °· Eat high-fiber foods every day. These foods may help prevent or relieve constipation. High-fiber foods include: °? Whole grain cereals and breads. °? Brown rice. °? Beans. °? Fresh fruits and vegetables. °Activity °· Return to your normal activities as told by your health care provider. Ask your health care provider  what activities are safe for you. °· Rest as much as possible. Try to rest or take a nap when your baby is sleeping. °· Do not lift anything that is heavier than your baby or 10 lb (4.5 kg) until your health care provider says that it is safe. °· Talk with your health care provider about when you can engage in sexual activity. This may depend on your: °? Risk of infection. °? Rate of healing. °? Comfort and desire to engage in sexual activity. °Vaginal Care °· If you have an episiotomy or a vaginal tear, check the area every day for signs of infection. Check for: °? More redness, swelling, or pain. °? More fluid or blood. °? Warmth. °? Pus or a bad smell. °· Do not use tampons or douches until your health care provider says this is safe. °· Watch for any blood clots that may pass from your vagina. These may look like clumps of dark red, brown, or black discharge. °General instructions °· Keep your perineum clean and dry as told by your health care provider. °· Wear loose, comfortable clothing. °· Wipe from front to back when you use the toilet. °· Ask your health care provider if you can shower or take a bath. If you had an episiotomy or a perineal tear during labor and delivery, your health care provider may tell you not to take baths for a certain length of time. °· Wear a bra that supports your breasts and fits you well. °· If possible, have someone help you with household activities and help care for your baby for at least a few days after   you leave the hospital. °· Keep all follow-up visits for you and your baby as told by your health care provider. This is important. °Contact a health care provider if: °· You have: °? Vaginal discharge that has a bad smell. °? Difficulty urinating. °? Pain when urinating. °? A sudden increase or decrease in the frequency of your bowel movements. °? More redness, swelling, or pain around your episiotomy or vaginal tear. °? More fluid or blood coming from your episiotomy or  vaginal tear. °? Pus or a bad smell coming from your episiotomy or vaginal tear. °? A fever. °? A rash. °? Little or no interest in activities you used to enjoy. °? Questions about caring for yourself or your baby. °· Your episiotomy or vaginal tear feels warm to the touch. °· Your episiotomy or vaginal tear is separating or does not appear to be healing. °· Your breasts are painful, hard, or turn red. °· You feel unusually sad or worried. °· You feel nauseous or you vomit. °· You pass large blood clots from your vagina. If you pass a blood clot from your vagina, save it to show to your health care provider. Do not flush blood clots down the toilet without having your health care provider look at them. °· You urinate more than usual. °· You are dizzy or light-headed. °· You have not breastfed at all and you have not had a menstrual period for 12 weeks after delivery. °· You have stopped breastfeeding and you have not had a menstrual period for 12 weeks after you stopped breastfeeding. °Get help right away if: °· You have: °? Pain that does not go away or does not get better with medicine. °? Chest pain. °? Difficulty breathing. °? Blurred vision or spots in your vision. °? Thoughts about hurting yourself or your baby. °· You develop pain in your abdomen or in one of your legs. °· You develop a severe headache. °· You faint. °· You bleed from your vagina so much that you fill two sanitary pads in one hour. °This information is not intended to replace advice given to you by your health care provider. Make sure you discuss any questions you have with your health care provider. °Document Released: 10/25/2000 Document Revised: 04/10/2016 Document Reviewed: 11/12/2015 °Elsevier Interactive Patient Education © 2018 [employer] Inc[employer] ° °

## 2019-10-24 ENCOUNTER — Emergency Department (HOSPITAL_COMMUNITY)
Admission: EM | Admit: 2019-10-24 | Discharge: 2019-10-24 | Disposition: A | Discharge status: Home / Self Care | Payer: Medicaid Other | Attending: Emergency Medicine | Admitting: Emergency Medicine

## 2019-10-24 ENCOUNTER — Emergency Department (HOSPITAL_COMMUNITY): Payer: Medicaid Other

## 2019-10-24 ENCOUNTER — Other Ambulatory Visit: Payer: Self-pay

## 2019-10-24 DIAGNOSIS — U071 COVID-19: Secondary | ICD-10-CM | POA: Diagnosis not present

## 2019-10-24 DIAGNOSIS — R509 Fever, unspecified: Secondary | ICD-10-CM | POA: Diagnosis present

## 2019-10-24 LAB — PREGNANCY, URINE: Preg Test, Ur: NEGATIVE

## 2019-10-24 LAB — RESPIRATORY PANEL BY RT PCR (FLU A&B, COVID)
Influenza A by PCR: NEGATIVE
Influenza B by PCR: NEGATIVE
SARS Coronavirus 2 by RT PCR: POSITIVE — AB

## 2019-10-24 MED ORDER — ACETAMINOPHEN 325 MG PO TABS
650.0000 mg | ORAL_TABLET | Freq: Once | ORAL | Status: AC
  Administered 2019-10-24: 650 mg via ORAL
  Filled 2019-10-24: qty 2

## 2019-10-24 MED ORDER — ACETAMINOPHEN 500 MG PO TABS: 500.0000 mg | ORAL_TABLET | Freq: Four times a day (QID) | ORAL | 0 refills | Status: AC | PRN

## 2019-10-24 MED ORDER — NAPROXEN 500 MG PO TABS: 500.0000 mg | ORAL_TABLET | Freq: Two times a day (BID) | ORAL | 0 refills | Status: AC | PRN

## 2019-10-24 NOTE — Discharge Instructions (Addendum)
Please take Tylenol, as prescribed, for fevers and chills.  You may also continue warm tea with honey at home for your sore throat and cough symptoms.  Recommend Delsym over-the-counter for symptomatic relief of your cough.  There are numerous COVID-19 testing sites in [place] where your family can obtain testing. [facility], [facility], [facility] on [address], as well as many pharmacies such as [facility] and [facility]. Please call ahead regarding wait times and pricing.  [address] [place], [place] [address]  Return to the ED or seek medical attention for any fevers or chills uncontrolled by Tylenol or Naproxen, inability to eat or drink, uncontrolled nausea or vomiting, or any other new or worsening symptoms.  Discontinue naproxen if your pregnancy status changes or if you begin breast-feeding.

## 2019-10-24 NOTE — ED Notes (Signed)
Patient verbalizes understanding of discharge instructions. Opportunity for questioning and answers were provided. Armband removed by staff, pt discharged from ED.  

## 2019-10-24 NOTE — ED Triage Notes (Signed)
Pt states she has had a fever and mucous for 1 day. Denies N/V/D. Pt would like a covid test.

## 2019-10-24 NOTE — ED Provider Notes (Signed)
[facility] EMERGENCY DEPARTMENT Provider Note   CSN: [id number] Arrival date & time: 10/24/19  1025     History Chief Complaint  Patient presents with  . Fever    Cindy Moreno is a 23 y.o. female with no relevant PMH who presents to the ED with complaints of fever x1 day.  Patient reports that she woke up this morning with body aches, fever, fatigue, diminished appetite, and cough productive of brownish mucus.  She reports that she felt fine yesterday.  No obvious sick contacts.  She was at home with her family and kids.  She did not receive the flu vaccine this year.  She does not take medications regularly and does not see a primary care provider.  She has a Mirena in place and does not believe that she is pregnant at this time.  She denies any chest pain, shortness of breath, headache or dizziness, sore throat, abdominal pain, nausea or vomiting, urinary symptoms, or changes in bowel habits.  She has consumed warm tea and her regular vitamins and herbs at home for relief.  Last COVID-19 testing obtained approximately 3 weeks ago when she returned from [place].  Offered language interpreter services, to which patient declined.  She is able to speak English well and there was no obvious language barrier.  HPI     Past Medical History:  Diagnosis Date  . Preeclampsia 2014    Patient Active Problem List   Diagnosis Date Noted  . Vaginal delivery 07/27/2018  . Fall 06/29/2018    Past Surgical History:  Procedure Laterality Date  . NO PAST SURGERIES       OB History    Gravida  2   Para  2   Term  2   Preterm      AB      Living  2     SAB      TAB      Ectopic      Multiple  0   Live Births  2           No family history on file.  Social History   Tobacco Use  . Smoking status: Never Smoker  . Smokeless tobacco: Never Used  Substance Use Topics  . Alcohol use: No  . Drug use: No    Home Medications Prior to  Admission medications   Medication Sig Start Date End Date Taking? Authorizing Provider  vitamin C (ASCORBIC ACID) 500 MG tablet Take 500 mg by mouth daily.   Yes [provider]  acetaminophen (TYLENOL) 500 MG tablet Take 1 tablet (500 mg total) by mouth every 6 (six) hours as needed for mild pain, fever or headache (Chills, body aches.). 10/24/19   Corena Herter, PA-C  ibuprofen (ADVIL,MOTRIN) 600 MG tablet Take 1 tablet (600 mg total) by mouth every 6 (six) hours. Patient not taking: Reported on 10/24/2019 07/29/18   Demetrius Revel, MD  naproxen (NAPROSYN) 500 MG tablet Take 1 tablet (500 mg total) by mouth 2 (two) times daily between meals as needed for moderate pain (Fevers, chills, body aches). 10/24/19   Corena Herter, PA-C    Allergies    Patient has no known allergies.  Review of Systems   Review of Systems  All other systems reviewed and are negative.   Physical Exam Updated Vital Signs BP 95/65   Pulse (!) 108   Temp (!) 100.6 F (38.1 C)   Resp 18  SpO2 99%   Physical Exam Vitals and nursing note reviewed. Exam conducted with a chaperone present.  Constitutional:      Appearance: Normal appearance.  HENT:     Head: Normocephalic and atraumatic.     Mouth/Throat:     Comments: Patent oropharynx.  No significant drainage or erythema.  No uvular deviation or masses appreciated.  No soft palate swelling or induration. Eyes:     General: No scleral icterus.    Conjunctiva/sclera: Conjunctivae normal.  Cardiovascular:     Rate and Rhythm: Regular rhythm.     Pulses: Normal pulses.     Heart sounds: Normal heart sounds.  Pulmonary:     Effort: Pulmonary effort is normal. No respiratory distress.  Abdominal:     General: Abdomen is flat. There is no distension.     Palpations: Abdomen is soft.     Tenderness: There is no abdominal tenderness. There is no guarding.  Musculoskeletal:     Cervical back: Normal range of motion and neck supple. No  rigidity.  Skin:    General: Skin is dry.  Neurological:     Mental Status: She is alert and oriented to person, place, and time.     GCS: GCS eye subscore is 4. GCS verbal subscore is 5. GCS motor subscore is 6.  Psychiatric:        Mood and Affect: Mood normal.        Behavior: Behavior normal.        Thought Content: Thought content normal.     ED Results / Procedures / Treatments   Labs (all labs ordered are listed, but only abnormal results are displayed) Labs Reviewed  RESPIRATORY PANEL BY RT PCR (FLU A&B, COVID) - Abnormal; Notable for the following components:      Result Value   SARS Coronavirus 2 by RT PCR POSITIVE (*)    All other components within normal limits  PREGNANCY, URINE    EKG None  Radiology DG Chest Portable 1 View  Result Date: 10/24/2019 CLINICAL DATA:  Cough EXAM: PORTABLE CHEST 1 VIEW COMPARISON:  None. FINDINGS: The heart size and mediastinal contours are within normal limits. Both lungs are clear. The visualized skeletal structures are unremarkable. IMPRESSION: No acute abnormality of the lungs in AP portable projection. Electronically Signed   By: Lauralyn Primes M.D.   On: 10/24/2019 13:10    Procedures Procedures (including critical care time)  Medications Ordered in ED Medications  acetaminophen (TYLENOL) tablet 650 mg (has no administration in time range)    ED Course  I have reviewed the triage vital signs and the nursing notes.  Pertinent labs & imaging results that were available during my care of the patient were reviewed by me and considered in my medical decision making (see chart for details).    MDM Rules/Calculators/A&P     Patient's history and physical exam is consistent with an upper respiratory infection, likely viral etiology.  DG Chest portable obtained demonstrated no focal consolidation concerning for pneumonia or any other acute cardiopulmonary abnormalities.  Will obtain testing for COVID-19 as well as influenza  testing given the brevity of her illness.  Tylenol provided for fever.  If influenza is positive, will treat with Tamiflu given her children at home and per patient request.  Otherwise, will discharge with COVID-19 testing pending.  2:43 PM Patient was negative for influenza, but positive for COVID-19.  Discussed over-the-counter medications for symptomatic relief.  Recommended Delsym for her cough symptoms.  She  may continue her use of tea and herbs at home, but will prescribe Tylenol for her fevers and chills.  She uses Mirena and her urine pregnancy is negative, so recommend that she may use naproxen as needed for her body aches.  Instructed her to discontinue naproxen should her pregnancy status change or if she begin breastfeeding.  Instruct patient return the ED or seek medical attention should she develop any difficulty breathing, uncontrolled nausea or vomiting, fevers or chills uncontrolled by Tylenol or ibuprofen, or any other new or worsening symptoms. All of the evaluation and work-up results were discussed with the patient and any family at bedside. They were provided opportunity to ask any additional questions and have none at this time. They have expressed understanding of verbal discharge instructions as well as return precautions and are agreeable to the plan.   Cindy Moreno was evaluated in Emergency Department on 10/24/2019 for the symptoms described in the history of present illness. She was evaluated in the context of the global COVID-19 pandemic, which necessitated consideration that the patient might be at risk for infection with the SARS-CoV-2 virus that causes COVID-19. Institutional protocols and algorithms that pertain to the evaluation of patients at risk for COVID-19 are in a state of rapid change based on information released by regulatory bodies including the CDC and federal and state organizations. These policies and algorithms were followed during the patient's care in  the ED.   Offered language interpreter services, to which patient declined.  She is able to speak English well and there was no obvious language barrier.   Final Clinical Impression(s) / ED Diagnoses Final diagnoses:  COVID-19    Rx / DC Orders ED Discharge Orders         Ordered    naproxen (NAPROSYN) 500 MG tablet  2 times daily between meals PRN     10/24/19 1441    acetaminophen (TYLENOL) 500 MG tablet  Every 6 hours PRN     10/24/19 1441           Elvera MariaGreen, Jayme Mednick L, PA-C 10/24/19 1450    Loren RacerYelverton, David, MD 10/24/19 1956

## 2021-07-15 IMAGING — DX DG CHEST 1V PORT
1 series · 1 of 1 positions shown · non-contrast
Comparison: None.

CLINICAL DATA: Cough

EXAM:
PORTABLE CHEST 1 VIEW

[chest ap]
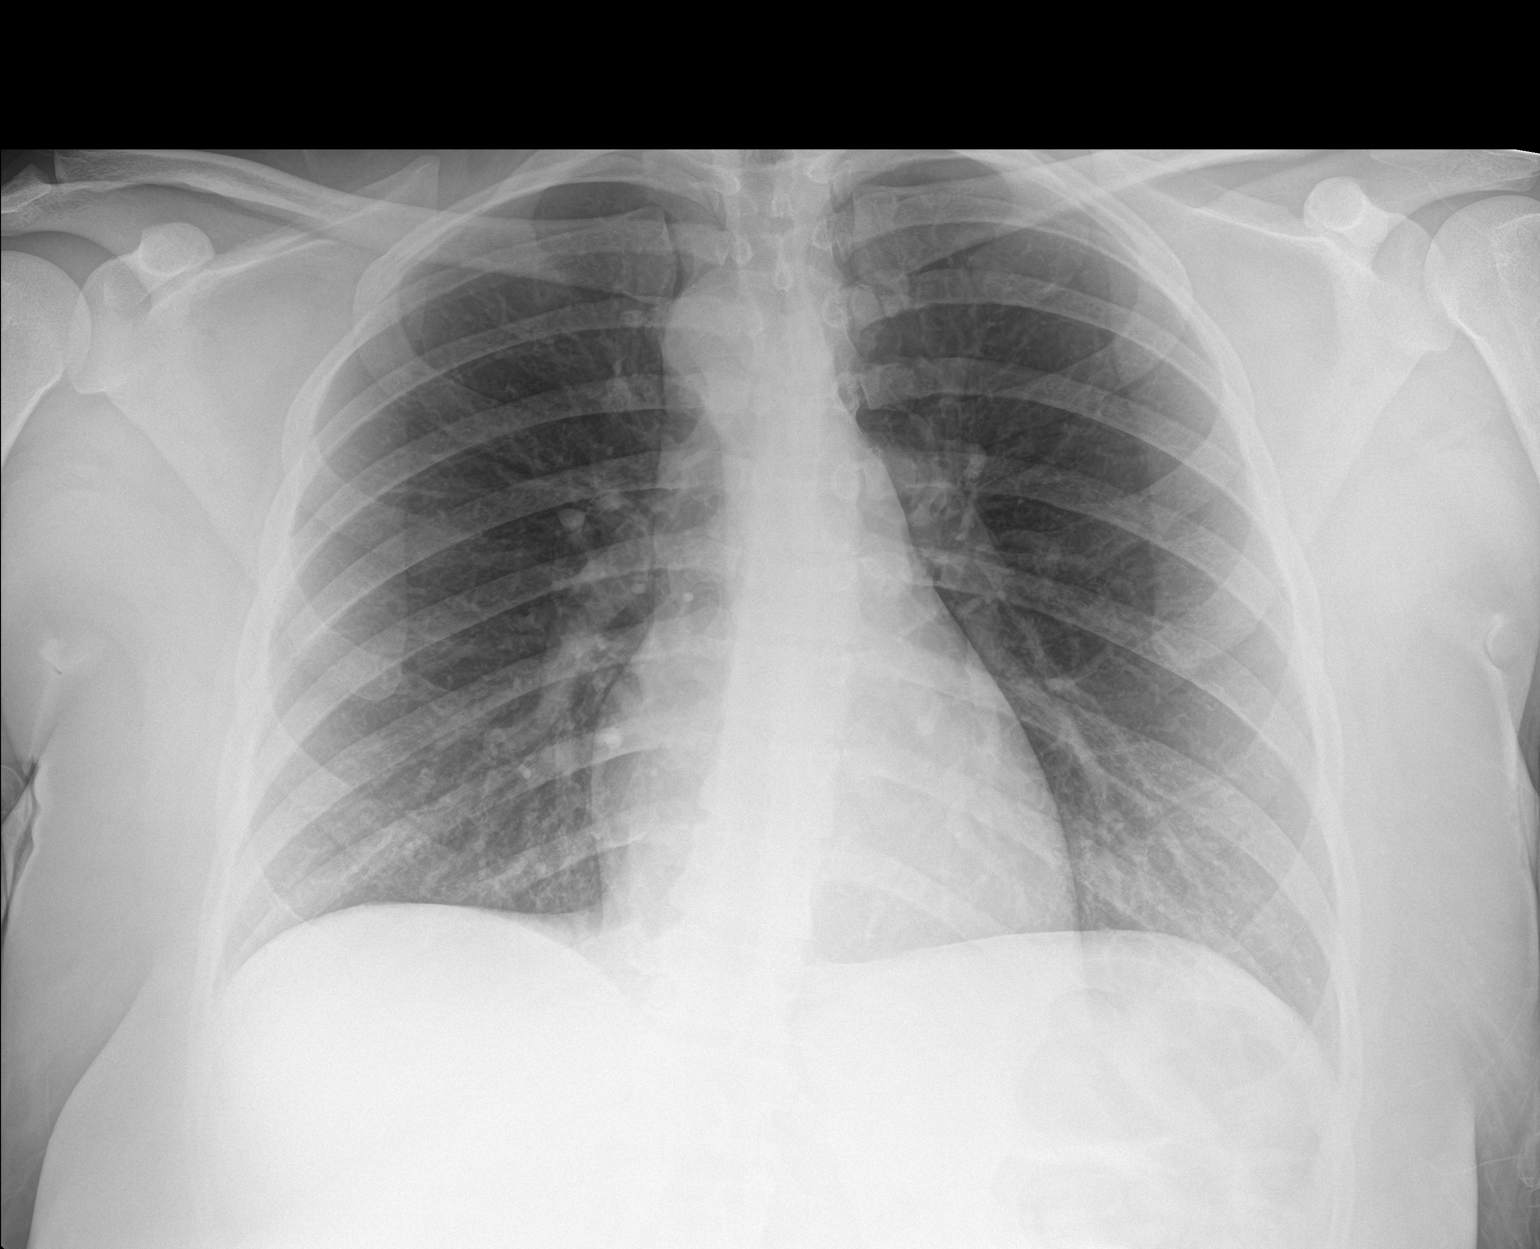

[1 of 1 positions shown; findings below may reference images not displayed]

FINDINGS: The heart size and mediastinal contours are within normal limits.
Both lungs are clear. The visualized skeletal structures are
unremarkable.
IMPRESSION: No acute abnormality of the lungs in AP portable projection.

## 2021-12-24 ENCOUNTER — Encounter: Payer: Self-pay | Admitting: Family Medicine

## 2023-01-01 ENCOUNTER — Encounter: Payer: Medicaid Other | Attending: Nurse Practitioner | Admitting: Skilled Nursing Facility1

## 2023-01-01 ENCOUNTER — Encounter: Payer: Self-pay | Admitting: Skilled Nursing Facility1

## 2023-01-01 DIAGNOSIS — E669 Obesity, unspecified: Secondary | ICD-10-CM | POA: Diagnosis present

## 2023-01-01 NOTE — Progress Notes (Signed)
Medical Nutrition Therapy  Appointment Start time:  ***  Appointment End time:  ***  Primary concerns today: weight loss  Referral diagnosis: e66.9 Preferred learning style: *** (auditory, visual, hands on, no preference indicated) Learning readiness: *** (not ready, contemplating, ready, change in progress)   NUTRITION ASSESSMENT    Clinical Medical Hx: HTN Medications:  Labs:  Notable Signs/Symptoms: 3 times a week having a bowel movement   Lifestyle & Dietary Hx  Pt states her real problem is candy stating she is an emotional eater. Pt states she has been cleaning when she gets stressed. Pt state she feels like the pressure is on to lose weight which is stressing her out. 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  Pt states she got rid of soda, beer only doing alcohol socially.    Estimated daily fluid intake: *** oz Supplements: multivitamin, ginger, magnesium, Vitamin D, tumeric  Sleep: 8 feeling rested  Stress / self-care: pt stated 4  Current average weekly physical activity: ***  24-Hr Dietary Recall First Meal: scrambled egg + bacon or oatmeal or pancake or 1 egg and 3 salami and plantain + butter  Snack: applesauce or dinner roll Second Meal: salmon patty + lettuce + mashed potato Snack: candy Third Meal: rice + sausage + chicken breast  Snack: oreos with milk Beverages: water, milk, 32 oz coffee + milk + sugar, lemonade, strawberry   Estimated Energy Needs Calories: 1800   NUTRITION DIAGNOSIS  {CHL AMB NUTRITIONAL DIAGNOSIS:[id number]}   NUTRITION INTERVENTION  Nutrition education (E-1) on the following topics:  ***  Handouts Provided Include  ***  Learning Style & Readiness for Change Teaching method utilized: Visual & Auditory  Demonstrated degree of understanding via: Teach Back  Barriers to learning/adherence to lifestyle change: ***  Goals Established by Pt Choose whole wheat or bean pasta over white pasta Choose brown rice  (cook in chicken broth) over white rice Choose whole wheat bread over white bread Choose oil over butter Choose lean meats over fatty meats such as beef and pork  Eat at least 1 full cup of vegetables with lunch AND dinner Reduce the sugar in your coffee Dance a couple times a week    MONITORING & EVALUATION Dietary intake, weekly physical activity  Next Steps  Patient is to ***.

## 2023-02-19 ENCOUNTER — Ambulatory Visit: Payer: Medicaid Other | Admitting: Skilled Nursing Facility1

## 2023-04-21 ENCOUNTER — Ambulatory Visit: Payer: Medicaid Other | Admitting: Skilled Nursing Facility1
